# Patient Record
Sex: Female | Born: 1995 | Race: White | Hispanic: No | Marital: Single | Smoking: Never smoker
Health system: Southern US, Community
[De-identification: ages and names within clinical notes are randomized; demographics above are authoritative.]

## PROBLEM LIST (undated history)

## (undated) HISTORY — PX: OTHER SURGICAL HISTORY: SHX169

## (undated) HISTORY — PX: TONSILLECTOMY: SUR1361

---
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
## 2014-06-13 IMAGING — US US RENAL
1 series · 14 of 25 positions shown · non-contrast
Comparison: CT abdomen pelvis 07/04/2012.

CLINICAL DATA: Pyelonephritis.  Status post stent placement and
stone removal from the left kidney.

RENAL/URINARY TRACT ULTRASOUND COMPLETE

[Series 1: us renal · 0.35mm/px · 14 of 26 slices shown]
[im 1/26]
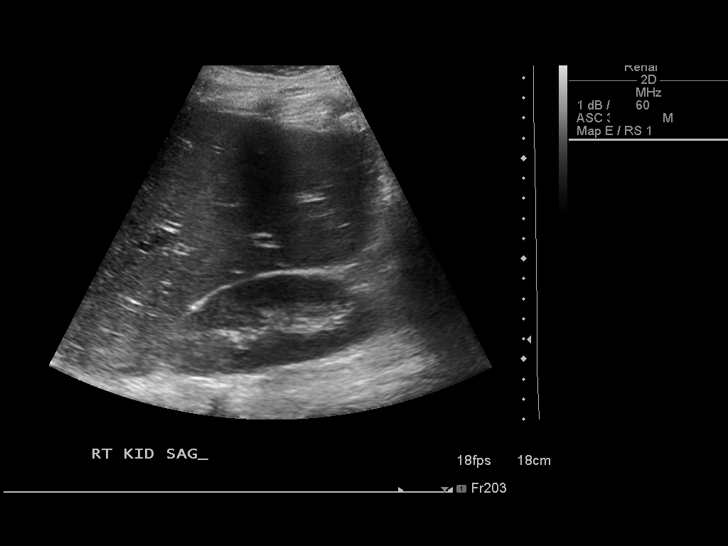
[im 3/26]
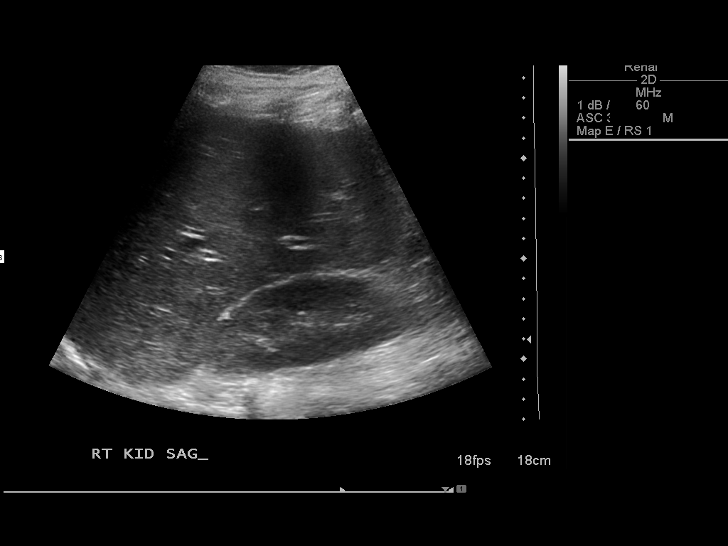
[im 5/26]
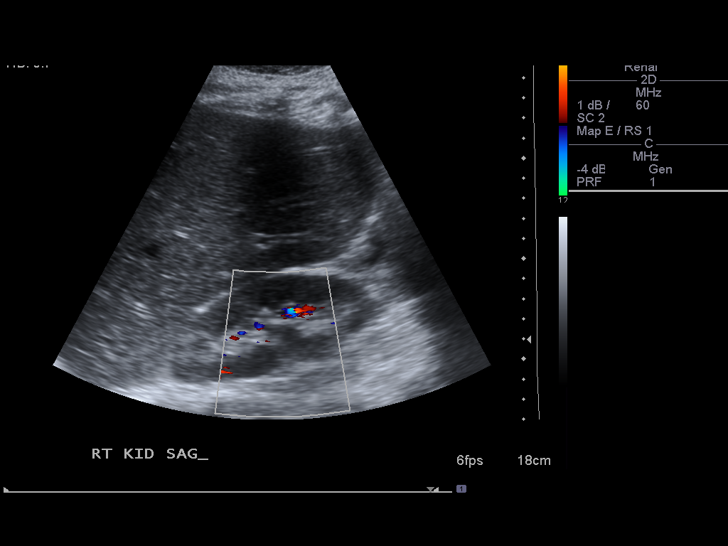
[im 7/26]
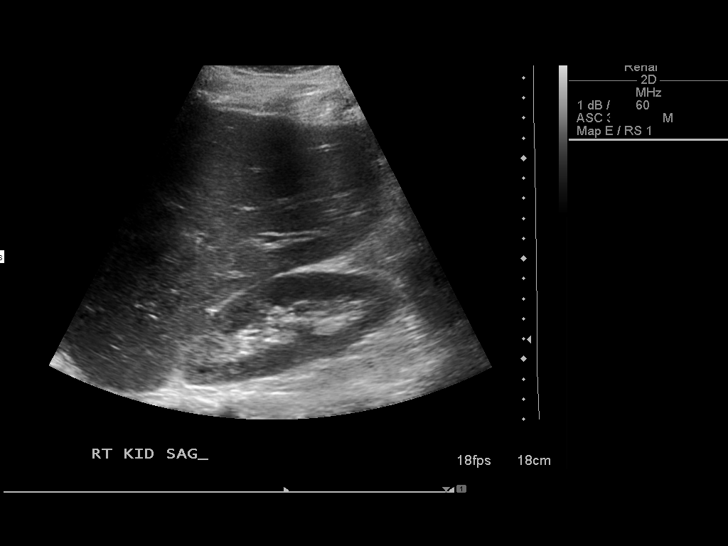
[im 9/26]
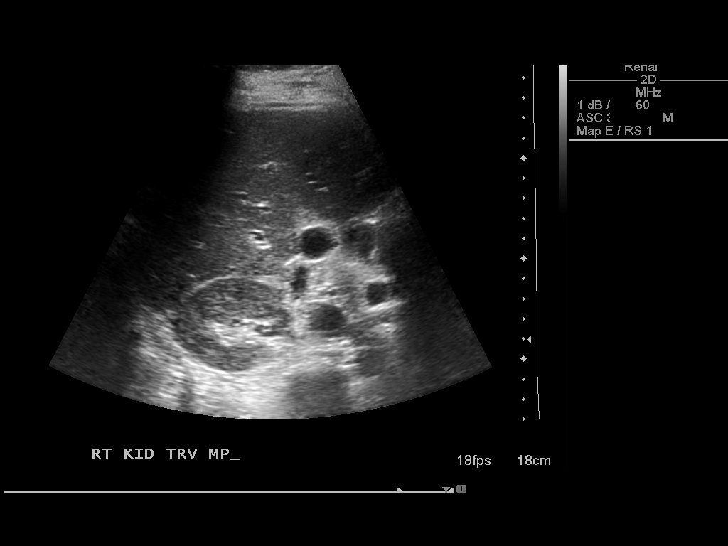
[im 10/26]
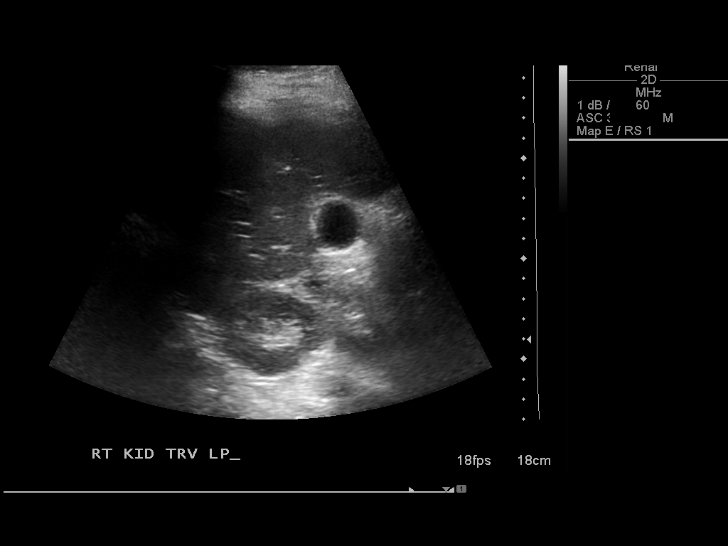
[im 12/26]
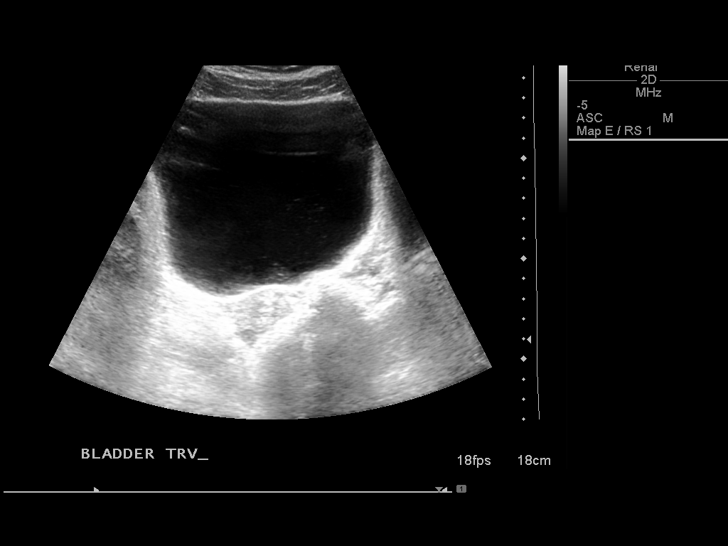
[im 14/26]
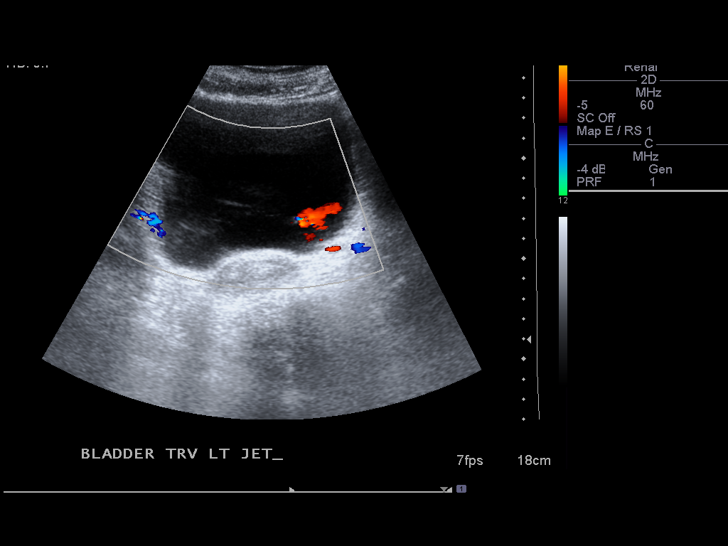
[im 16/26]
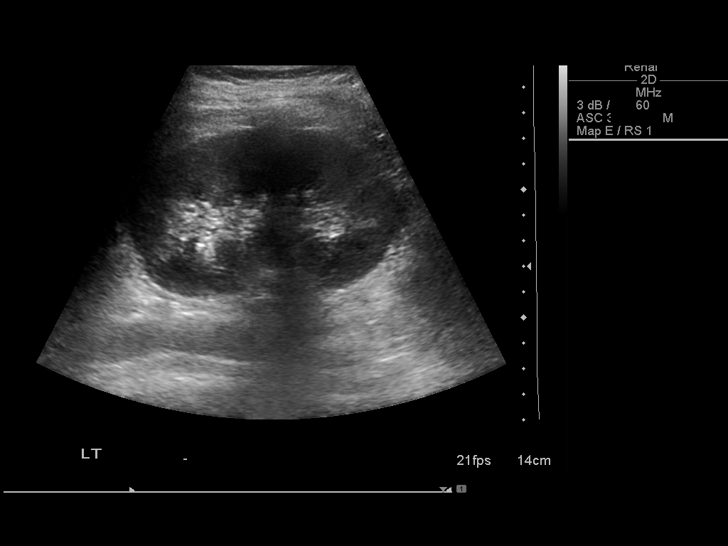
[im 17/26]
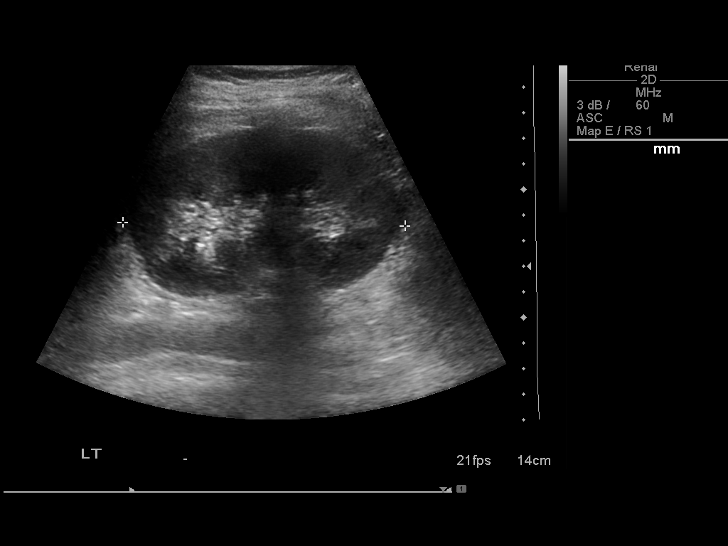
[im 19/26]
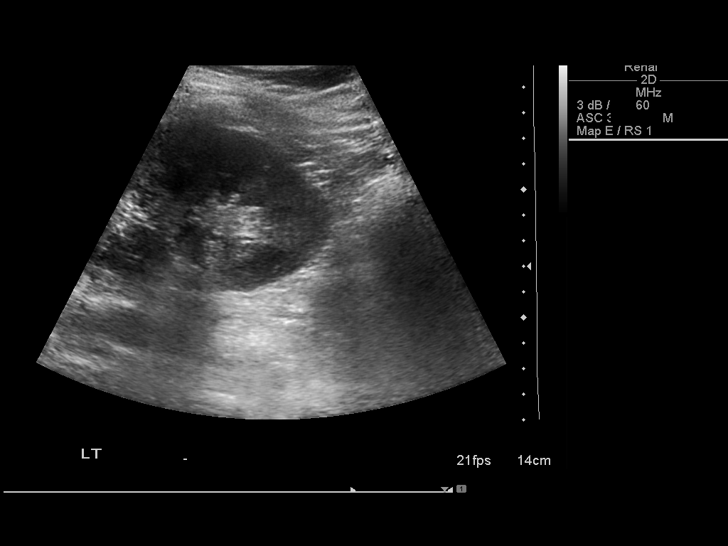
[im 21/26]
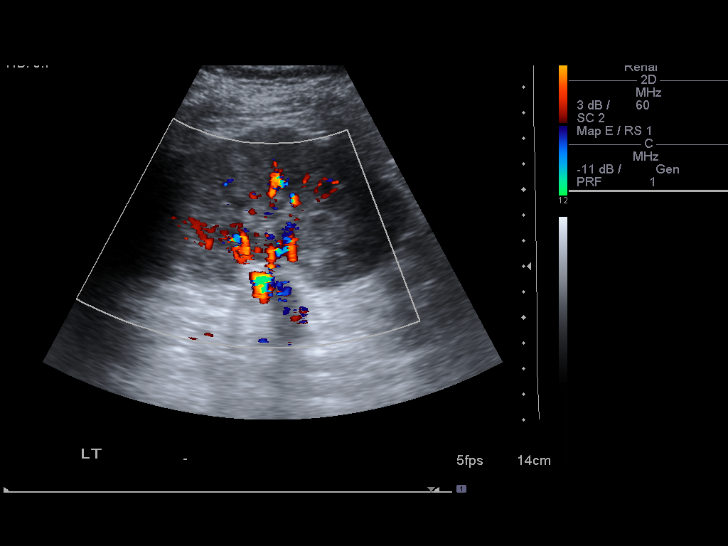
[im 23/26]
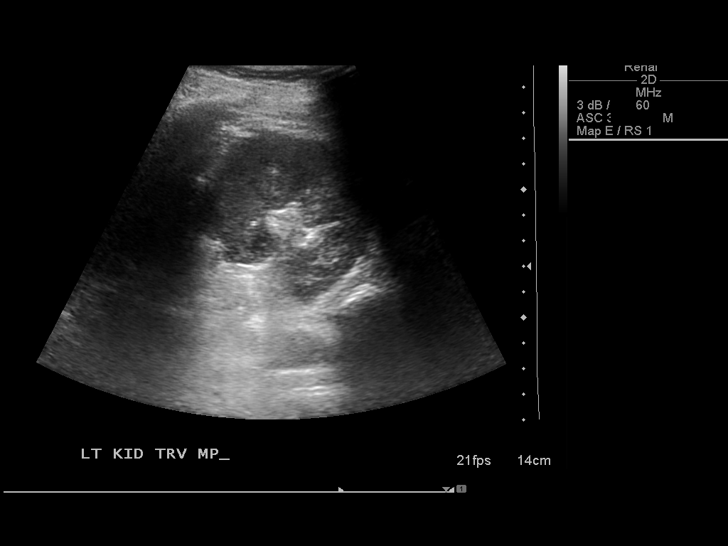
[im 26/26]
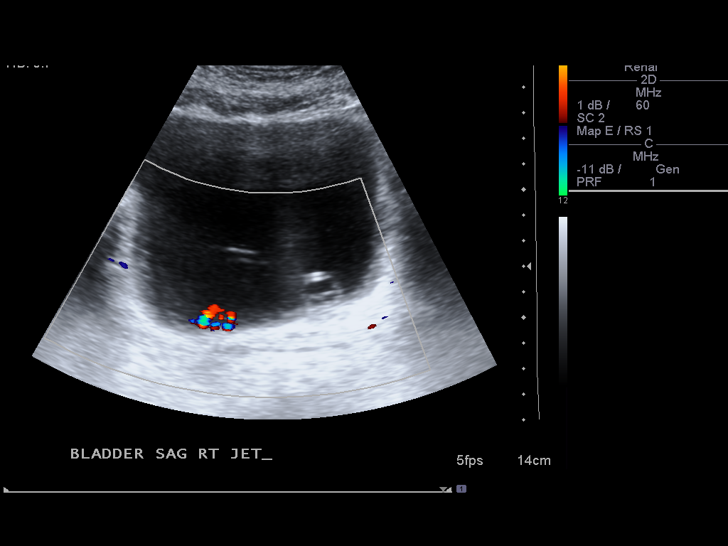

[14 of 25 positions shown; findings below may reference images not displayed]

FINDINGS: Right Kidney:  Measures 11.6 cm.  The right kidney appears normal
without stone, mass or hydronephrosis.

Left Kidney:  Measures 11.1 cm.  No stone, mass or hydronephrosis
is identified.  Double-J ureteral stent is noted.

Bladder:  Double J ureteral stent on the left is identified.
Bilateral ureteral jets are seen.
IMPRESSION: Negative for hydronephrosis or acute abnormality with a left double-
J stent in place.

## 2014-12-11 ENCOUNTER — Ambulatory Visit (INDEPENDENT_AMBULATORY_CARE_PROVIDER_SITE_OTHER): Payer: BLUE CROSS/BLUE SHIELD | Admitting: Sports Medicine

## 2014-12-11 ENCOUNTER — Encounter: Payer: Self-pay | Admitting: Sports Medicine

## 2014-12-11 VITALS — BP 116/80 | HR 71 | Ht 69.0 in | Wt 150.0 lb

## 2014-12-11 DIAGNOSIS — M25572 Pain in left ankle and joints of left foot: Secondary | ICD-10-CM | POA: Diagnosis not present

## 2014-12-11 NOTE — Progress Notes (Signed)
   Subjective:    Patient ID: Jessica Moon, female    DOB: Apr 04, 1995, 20 y.o.   MRN: [id number]  HPI chief complaint: Left foot pain  19 year old female comes in today complaining of left foot pain that has been present since May. No trauma but a gradual onset of pain which was eventually diagnosed as a second metatarsal stress fracture via MRI. She was initially immobilized in a walking boot for about 6 weeks. She then transitioned into a regular shoe with a steel shank. Despite all that she has had persistent pain across the dorsum of her foot. She does localize her pain primarily to the proximal second metatarsal. She has not noticed any swelling. She denies numbness or tingling. Follow-up x-rays were done earlier this week which were unremarkable per the patient's father.  Past medical history is reviewed Medications are reviewed Allergies are reviewed Socially she does not smoke, is a Holiday representative at [employer]    Review of Systems As above    Objective:   Physical Exam Well-developed, well-nourished. No acute distress. Awake alert and oriented 3. Vital signs reviewed.  Left foot: She is tender to palpation over the proximal to midportion of the second metatarsal. Mild pain with metatarsal squeeze. No soft tissue swelling. She has a fairly well-preserved longitudinal arch but does have a long and narrow foot. Neurovascularly intact distally. She is able to walk without a significant limp. Fairly neutral gait.  MSK ultrasound: Limited MSK ultrasound of the second metatarsal was performed. The longitudinal view does show changes in the mid second metatarsal consistent with callus formation from a remote stress fracture. I do not see any cortical irregularity to suggest nonunion. No significant edema.       Assessment & Plan:  Persistent left foot pain status post second metatarsal stress fracture  We will try the patient in a pair of custom orthotics. The steel shank may be a  factor in her prolonged symptoms so I would like for her to discontinue this if possible. She may continue with activity as tolerated but I recommended against any sort of impact activity such as running until she is completely pain-free. She is ok to bike. Follow-up as needed.  Patient was fitted for a : standard, cushioned, semi-rigid orthotic. The orthotic was heated and afterward the patient stood on the orthotic blank positioned on the orthotic stand. The patient was positioned in subtalar neutral position and 10 degrees of ankle dorsiflexion in a weight bearing stance. After completion of molding, a stable base was applied to the orthotic blank. The blank was ground to a stable position for weight bearing. Size:9 Base: Blue EVA Posting: none Additional orthotic padding: none  Patient found the orthotics comfortable prior to leaving the office. Total time spent with the patient was 30 minutes with greater than 50% of the time spent in face to face consultation.

## 2018-03-11 ENCOUNTER — Other Ambulatory Visit: Payer: Self-pay | Admitting: Physical Medicine and Rehabilitation

## 2018-03-11 DIAGNOSIS — M545 Low back pain, unspecified: Secondary | ICD-10-CM

## 2018-03-11 DIAGNOSIS — G8929 Other chronic pain: Secondary | ICD-10-CM

## 2018-03-14 ENCOUNTER — Ambulatory Visit
Admission: RE | Admit: 2018-03-14 | Discharge: 2018-03-14 | Disposition: A | Discharge status: Home / Self Care | Payer: No Typology Code available for payment source | Source: Ambulatory Visit | Attending: Physical Medicine and Rehabilitation | Admitting: Physical Medicine and Rehabilitation

## 2018-03-14 DIAGNOSIS — G8929 Other chronic pain: Secondary | ICD-10-CM

## 2018-03-14 DIAGNOSIS — M545 Low back pain, unspecified: Secondary | ICD-10-CM

## 2018-03-14 MED ORDER — IOPAMIDOL (ISOVUE-M 200) INJECTION 41%
1.0000 mL | Freq: Once | INTRAMUSCULAR | Status: AC
  Administered 2018-03-14: 1 mL via EPIDURAL

## 2018-03-14 MED ORDER — METHYLPREDNISOLONE ACETATE 40 MG/ML INJ SUSP (RADIOLOG
120.0000 mg | Freq: Once | INTRAMUSCULAR | Status: AC
  Administered 2018-03-14: 120 mg via EPIDURAL

## 2018-03-14 NOTE — Discharge Instructions (Signed)

## 2018-08-04 ENCOUNTER — Other Ambulatory Visit: Payer: Self-pay

## 2018-08-04 ENCOUNTER — Ambulatory Visit (INDEPENDENT_AMBULATORY_CARE_PROVIDER_SITE_OTHER): Payer: No Typology Code available for payment source | Admitting: Sports Medicine

## 2018-08-04 ENCOUNTER — Encounter: Payer: Self-pay | Admitting: Sports Medicine

## 2018-08-04 VITALS — BP 108/64 | Ht 70.0 in | Wt 150.0 lb

## 2018-08-04 DIAGNOSIS — M25572 Pain in left ankle and joints of left foot: Secondary | ICD-10-CM

## 2018-08-04 NOTE — Progress Notes (Signed)
   Subjective:    Patient ID: Jessica Moon, female    DOB: Dec 23, 1995, 23 y.o.   MRN: [id number]  HPI chief complaint: Left foot pain  Raegen is a 23 year old female that comes in today with several weeks of left foot pain.  She has been doing quite a bit of walking and began to develop insidious foot pain.  She has a history of a previous stress fracture in the same foot.  Recent MRI of her foot was negative for new stress fracture.  She was given custom orthotics in 2016 which have been helpful.  She is here today for new custom orthotics.  She localizes her foot pain to both her arch as well as the dorsum of the foot.  She brought her walking shoes with her today.  Past medical history reviewed Surgical history reviewed.  History is significant for recent L5-S1 lumbar fusion.  Surgery was in March and she is doing well postoperatively. Medications reviewed   Review of Systems    As above Objective:   Physical Exam  Well-developed, well-nourished.  No acute distress.  Awake alert and oriented x3.  Vital signs reviewed  Examination of her feet in the standing position shows a well preserved longitudinal arch.  There is no obvious swelling of either foot.  She does have a long narrow foot.  Evaluation of her gait shows slight supination.  No limp.     Assessment & Plan:   Left foot pain Status post L5-S1 lumbar fusion  New custom orthotics were created today.  I did leave the lateral aspect of the heel a little more elevated to help correct her slight supination.  Her gait was neutral with the orthotics in place.  She found them to be comfortable prior to leaving the office.  Her current walking shoes are quite old (she thinks that she has had them for about a year).  I recommended that she purchase a new pair of walking shoes and replace them every 300 to 500 miles.  Follow-up as needed.  Patient was fitted for a : standard, cushioned, semi-rigid orthotic. The orthotic was heated and  afterward the patient stood on the orthotic blank positioned on the orthotic stand. The patient was positioned in subtalar neutral position and 10 degrees of ankle dorsiflexion in a weight bearing stance. After completion of molding, a stable base was applied to the orthotic blank. The blank was ground to a stable position for weight bearing. Size: 10 Base: Blue EVA Posting: none Additional orthotic padding: none

## 2018-10-01 ENCOUNTER — Encounter (HOSPITAL_COMMUNITY): Payer: Self-pay | Admitting: Radiology

## 2018-10-01 ENCOUNTER — Other Ambulatory Visit: Payer: Self-pay

## 2018-10-01 ENCOUNTER — Emergency Department (HOSPITAL_COMMUNITY): Payer: No Typology Code available for payment source

## 2018-10-01 ENCOUNTER — Emergency Department (HOSPITAL_COMMUNITY)
Admission: EM | Admit: 2018-10-01 | Discharge: 2018-10-01 | Disposition: A | Discharge status: Home / Self Care | Payer: No Typology Code available for payment source | Attending: Emergency Medicine | Admitting: Emergency Medicine

## 2018-10-01 DIAGNOSIS — Z20828 Contact with and (suspected) exposure to other viral communicable diseases: Secondary | ICD-10-CM | POA: Diagnosis not present

## 2018-10-01 DIAGNOSIS — R509 Fever, unspecified: Secondary | ICD-10-CM | POA: Diagnosis present

## 2018-10-01 DIAGNOSIS — R1032 Left lower quadrant pain: Secondary | ICD-10-CM | POA: Diagnosis not present

## 2018-10-01 DIAGNOSIS — L03311 Cellulitis of abdominal wall: Secondary | ICD-10-CM | POA: Insufficient documentation

## 2018-10-01 DIAGNOSIS — Z79899 Other long term (current) drug therapy: Secondary | ICD-10-CM | POA: Insufficient documentation

## 2018-10-01 LAB — BASIC METABOLIC PANEL
Anion gap: 10 (ref 5–15)
BUN: 8 mg/dL (ref 6–20)
CO2: 22 mmol/L (ref 22–32)
Calcium: 8.8 mg/dL — ABNORMAL LOW (ref 8.9–10.3)
Chloride: 106 mmol/L (ref 98–111)
Creatinine, Ser: 0.83 mg/dL (ref 0.44–1.00)
GFR calc Af Amer: >60 mL/min (ref >60)
GFR calc non Af Amer: >60 mL/min (ref >60)
Glucose, Bld: 97 mg/dL (ref 70–99)
Potassium: 3.4 mmol/L — ABNORMAL LOW (ref 3.5–5.1)
Sodium: 138 mmol/L (ref 135–145)

## 2018-10-01 LAB — URINALYSIS, ROUTINE W REFLEX MICROSCOPIC
Bilirubin Urine: NEGATIVE
Glucose, UA: NEGATIVE mg/dL
Hgb urine dipstick: NEGATIVE
Ketones, ur: 5 mg/dL — AB
Leukocytes,Ua: NEGATIVE
Nitrite: NEGATIVE
Protein, ur: NEGATIVE mg/dL
Specific Gravity, Urine: 1.013 (ref 1.005–1.030)
pH: 6 (ref 5.0–8.0)

## 2018-10-01 LAB — I-STAT BETA HCG BLOOD, ED (MC, WL, AP ONLY): I-stat hCG, quantitative: <5 m[IU]/mL (ref <5)

## 2018-10-01 LAB — CBC WITH DIFFERENTIAL/PLATELET
Abs Immature Granulocytes: 0.03 10*3/uL (ref 0.00–0.07)
Basophils Absolute: 0 10*3/uL (ref 0.0–0.1)
Basophils Relative: 0 %
Eosinophils Absolute: 0 10*3/uL (ref 0.0–0.5)
Eosinophils Relative: 1 %
HCT: 41 % (ref 36.0–46.0)
Hemoglobin: 14 g/dL (ref 12.0–15.0)
Immature Granulocytes: 1 %
Lymphocytes Relative: 23 %
Lymphs Abs: 1.3 10*3/uL (ref 0.7–4.0)
MCH: 30.4 pg (ref 26.0–34.0)
MCHC: 34.1 g/dL (ref 30.0–36.0)
MCV: 88.9 fL (ref 80.0–100.0)
Monocytes Absolute: 0.7 10*3/uL (ref 0.1–1.0)
Monocytes Relative: 13 %
Neutro Abs: 3.5 10*3/uL (ref 1.7–7.7)
Neutrophils Relative %: 62 %
Platelets: 159 10*3/uL (ref 150–400)
RBC: 4.61 MIL/uL (ref 3.87–5.11)
RDW: 12.2 % (ref 11.5–15.5)
WBC: 5.6 10*3/uL (ref 4.0–10.5)
nRBC: 0 % (ref 0.0–0.2)

## 2018-10-01 LAB — SARS CORONAVIRUS 2 BY RT PCR (HOSPITAL ORDER, PERFORMED IN ~~LOC~~ HOSPITAL LAB): SARS Coronavirus 2: NEGATIVE

## 2018-10-01 LAB — LACTIC ACID, PLASMA: Lactic Acid, Venous: 0.7 mmol/L (ref 0.5–1.9)

## 2018-10-01 MED ORDER — SODIUM CHLORIDE 0.9 % IV BOLUS
1000.0000 mL | Freq: Once | INTRAVENOUS | Status: AC
  Administered 2018-10-01: 1000 mL via INTRAVENOUS

## 2018-10-01 MED ORDER — IOHEXOL 300 MG/ML  SOLN
100.0000 mL | Freq: Once | INTRAMUSCULAR | Status: AC | PRN
  Administered 2018-10-01: 100 mL via INTRAVENOUS

## 2018-10-01 NOTE — ED Notes (Signed)
ED Provider at bedside. 

## 2018-10-01 NOTE — ED Triage Notes (Addendum)
Pt arrives POV from home c/o fever, body aches, pelvic pain, and feeling lethargic. Pt reports sxs started Tuesday, but fever did not develop til yesterday (temp range 100-104 per pt). Pt was seen at gyno's office yesterday for in-office procedure and has taken 2 grams of rocephin IM (1 gram yesterday and 1 gram today). Pt reports being around friend who has been in contact with someone who is COVID positive.

## 2018-10-01 NOTE — Discharge Instructions (Addendum)
Take the Keflex and Bactrim and watch for worsening of the infection.  Blood cultures were done and you will be notified if they grow anything out.

## 2018-10-01 NOTE — ED Notes (Signed)
Patient transported to CT 

## 2018-10-01 NOTE — ED Notes (Signed)
Patient verbalizes understanding of discharge instructions. Opportunity for questioning and answers were provided. Armband removed by staff, pt discharged from ED.  

## 2018-10-01 NOTE — ED Provider Notes (Signed)
[facility] Provider Note   CSN: [id number] Arrival date & time: 10/01/18  1057    History   Chief Complaint Chief Complaint  Patient presents with  . Fever  . Generalized Body Aches    chills/nausea    HPI Jessica Moon is a 23 y.o. female.     HPI Patient presents with fever.  Recently treated for cellulitis/abscess of left groin.  Began a few days ago.  Initially seen at urgent care and thought was a cellulitis.  Then went to gynecology who drained an abscess couple days ago.  Given shot of Rocephin IM yesterday and then had another shot at home today.  Also given oral Bactrim.  States fevers started yesterday but went up to 100.4 this morning.  No cough.  No dysuria.  The redness was marked on her abdomen/groin area and his expanded outside the demarcated area.  States she feels as if the pain is improving in the area however.  No chest pain.  No trouble breathing.  No direct COVID contacts.  Denies possibly a pregnancy.  She had Tylenol this morning at home. History reviewed. No pertinent past medical history.  Patient Active Problem List   Diagnosis Date Noted  . Fever 09/21/2012  . Sore throat 09/21/2012  . Nephrolithiasis 07/05/2012  . Acute pyelonephritis 07/05/2012  . Normocytic anemia 07/05/2012  . Hyperglycemia 07/05/2012  . 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 Sepsis(995.91) 07/05/2012  . Hypotension 07/05/2012    Past Surgical History:  Procedure Laterality Date  . CYSTOSCOPY W/ URETERAL STENT PLACEMENT Left 07/04/2012   Procedure: CYSTOSCOPY WITH STENT REPLACEMENT;  Surgeon: Kathi LudwigSigmund I Tannenbaum, MD;  Location: [facility];  Service: Urology;  Laterality: Left;  . CYSTOSCOPY/RETROGRADE/URETEROSCOPY/STONE EXTRACTION WITH BASKET Left 07/04/2012   Procedure: CYSTOSCOPY/RETROGRADE LEFT RETROGRADE/LEFT URETEROSCOPY/STONE EXTRACTION WITH BASKET/INSERTION DOUBLE J STENT LEFT;  Surgeon: Kathi LudwigSigmund I Tannenbaum, MD;  Location:  [facility];  Service: Urology;  Laterality: Left;  . TONSILLECTOMY    . Tubes in ears, nasal reconstruction Bilateral      OB History   No obstetric history on file.      Home Medications    Prior to Admission medications   Medication Sig Start Date End Date Taking? Authorizing Provider  acetaminophen (TYLENOL) 500 MG tablet Take 1,000 mg by mouth every 6 (six) hours as needed for pain.    [provider]  amphetamine-dextroamphetamine (ADDERALL) 15 MG tablet Take 15 mg by mouth daily.    [provider]  celecoxib (CELEBREX) 200 MG capsule Take 200 mg by mouth daily.    [provider]  ciprofloxacin (CIPRO) 750 MG tablet Take 1 tablet (750 mg total) by mouth 2 (two) times daily. Patient not taking: Reported on 12/11/2014 07/08/12   Jethro Bolusannenbaum, Sigmund, MD  etonogestrel (NEXPLANON) 68 MG IMPL implant 1 each by Subdermal route once.    [provider]  traMADol-acetaminophen (ULTRACET) 37.5-325 MG per tablet Take 1 tablet by mouth every 6 (six) hours as needed for pain. Patient not taking: Reported on 12/11/2014 07/08/12   Jethro Bolusannenbaum, Sigmund, MD    Family History No family history on file.  Social History Social History   Tobacco Use  . Smoking status: Never Smoker  . Smokeless tobacco: Never Used  Substance Use Topics  . Alcohol use: Yes    Comment: socially, beer  . Drug use: No     Allergies   Patient has no known allergies.   Review of Systems  Review of Systems  Constitutional: Positive for appetite change, chills and fever.  HENT: Negative for congestion.   Respiratory: Negative for shortness of breath.   Cardiovascular: Negative for chest pain.  Gastrointestinal: Negative for abdominal pain.  Genitourinary: Negative for flank pain.  Musculoskeletal: Negative for neck pain.  Skin: Positive for wound.  Neurological: Negative for weakness.  Psychiatric/Behavioral: Negative for confusion.     Physical Exam Updated Vital Signs  BP 110/72 (BP Location: Left Arm)   Pulse 64   Temp 98.1 F (36.7 C) (Oral)   Resp 16   SpO2 100%   Physical Exam Vitals signs and nursing note reviewed.  Constitutional:      Appearance: Normal appearance.  HENT:     Head: Normocephalic.  Eyes:     Pupils: Pupils are equal, round, and reactive to light.  Cardiovascular:     Rate and Rhythm: Regular rhythm.  Pulmonary:     Breath sounds: No wheezing, rhonchi or rales.  Abdominal:     Tenderness: There is no abdominal tenderness.  Skin:    General: Skin is warm.     Capillary Refill: Capillary refill takes less than 2 seconds.     Comments: Left inguinal/groin area erythema with mild induration.  No fluctuance.  Does progress somewhat laterally and slightly down his labial area.  Neurological:     Mental Status: Mental status is at baseline.      ED Treatments / Results  Labs (all labs ordered are listed, but only abnormal results are displayed) Labs Reviewed  BASIC METABOLIC PANEL - Abnormal; Notable for the following components:      Result Value   Potassium 3.4 (*)    Calcium 8.8 (*)    All other components within normal limits  SARS CORONAVIRUS 2 (HOSPITAL ORDER, [facility])  CULTURE, BLOOD (ROUTINE X 2)  CULTURE, BLOOD (ROUTINE X 2)  LACTIC ACID, PLASMA  CBC WITH DIFFERENTIAL/PLATELET  LACTIC ACID, PLASMA  URINALYSIS, ROUTINE W REFLEX MICROSCOPIC  I-STAT BETA HCG BLOOD, ED ([facility], [facility], [facility] ONLY)    EKG None  Radiology Ct Pelvis W Contrast  Result Date: 10/01/2018 CLINICAL DATA:  Left groin cellulitis, postop infection EXAM: CT PELVIS WITH CONTRAST TECHNIQUE: Multidetector CT imaging of the pelvis was performed using the standard protocol following the bolus administration of intravenous contrast. CONTRAST:  150mL OMNIPAQUE IOHEXOL 300 MG/ML  SOLN COMPARISON:  MR 03/28/2018, CT 09/21/2012 FINDINGS: Urinary Tract: Urinary bladder is nondistended. Distal ureters decompressed. Bowel:  Visualized portions of small bowel and colon are nondilated, unremarkable. Vascular/Lymphatic: No significant vascular pathology evident. 1 cm prominent left inguinal lymph node possibly reactive. Scattered subcentimeter morphologically unremarkable Dr. Has not external iliac and right inguinal lymph nodes. Reproductive:  No mass or other significant abnormality Other:  Trace cul-de-sac free fluid. Left pelvic phleboliths. Musculoskeletal: Skin thickening overlying the left mons pubis and inguinal region, with inflammatory/edematous changes in the deep subcutaneous tissues in these regions. No abnormal enhancement or discrete fluid collection. The inflammatory changes do not extend deep to the body wall musculature. Interval changes of instrumented posterolateral and interbody fusion L5-S1 without apparent complication. No fracture or worrisome bone lesion. IMPRESSION: 1. Skin thickening and inflammatory/edematous changes in the deep subcutaneous tissues of the left inguinal region, suggesting cellulitis, without evidence of abscess. 2. Trace cul-de-sac free fluid, nonspecific. 3. Interval L5-S1 instrumented and interbody fusion without apparent complication. Electronically Signed   By: Lucrezia Europe M.D.   On: 10/01/2018 13:47  Procedures Procedures (including critical care time)  Medications Ordered in ED Medications  sodium chloride 0.9 % bolus 1,000 mL (0 mLs Intravenous Stopped 10/01/18 1246)  iohexol (OMNIPAQUE) 300 MG/ML solution 100 mL (100 mLs Intravenous Contrast Given 10/01/18 1334)     Initial Impression / Assessment and Plan / ED Course  I have reviewed the triage vital signs and the nursing notes.  Pertinent labs & imaging results that were available during my care of the patient were reviewed by me and considered in my medical decision making (see chart for details).        Patient with cellulitis lower abdominal wall.  Had temperature up to 104 at home.  However lab work is very  reassuring.  Feels better now.  CT scan done did not show any abscess.  Discharged with patient's father, who is physician.  Will take both the Keflex she was initially given and the Bactrim she has at home.  Do not think this is the failure of antibiotics yet.  Final Clinical Impressions(s) / ED Diagnoses   Final diagnoses:  Cellulitis of abdominal wall    ED Discharge Orders    None       Benjiman CorePickering, Letesha Klecker, MD 10/01/18 1406

## 2018-10-06 LAB — CULTURE, BLOOD (ROUTINE X 2)
Culture: NO GROWTH
Culture: NO GROWTH
Special Requests: ADEQUATE
Special Requests: ADEQUATE

## 2020-02-19 IMAGING — XA Imaging study
2 series · 2 of 2 positions shown · non-contrast
Comparison: none

CLINICAL DATA: Low back pain. Bilateral lower extremity
radiculopathy, right greater than left. Displacement the L4-5 and
L5-S1 lumbar discs.

[Series 1: ortho standard · 1 of 1 slices shown (1 of 2)]
[im 1/1]
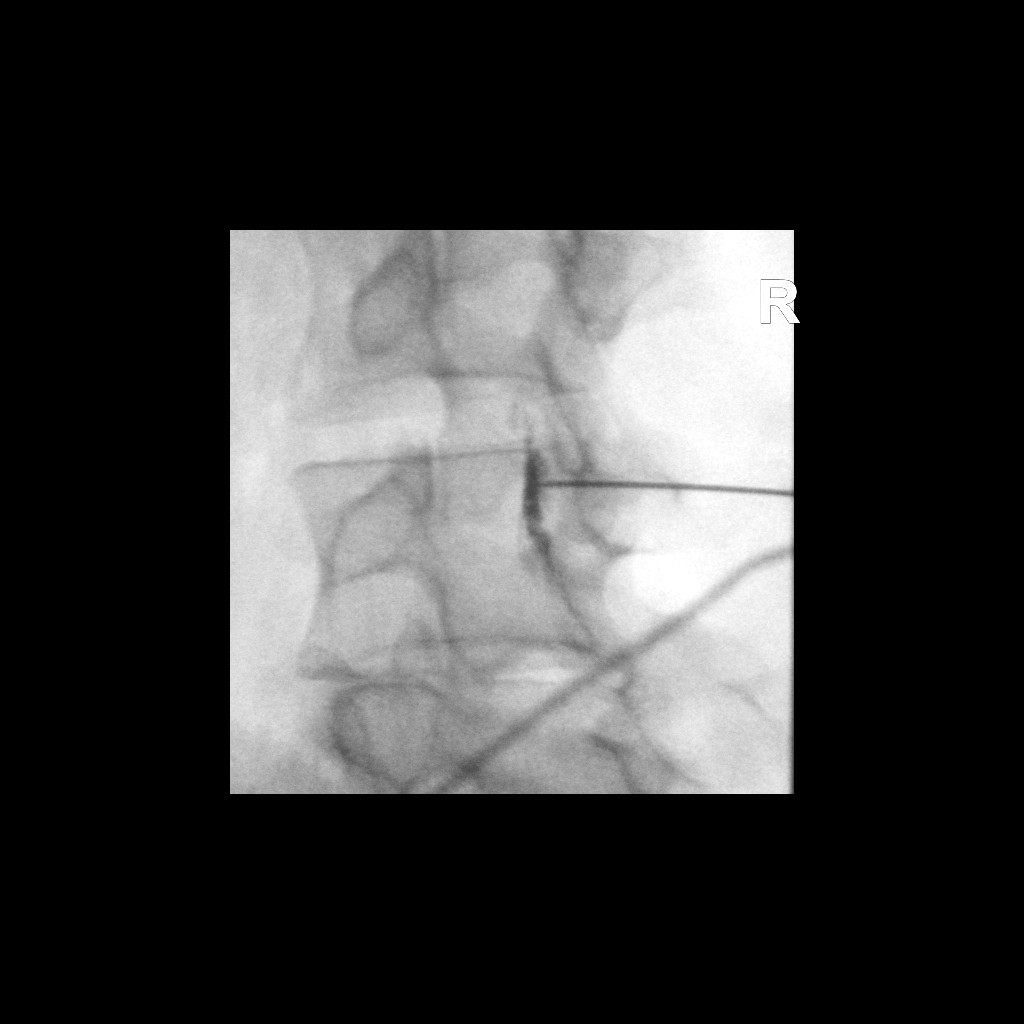

[Series 2: ortho standard · 1 of 1 slices shown (2 of 2)]
[im 1/1]
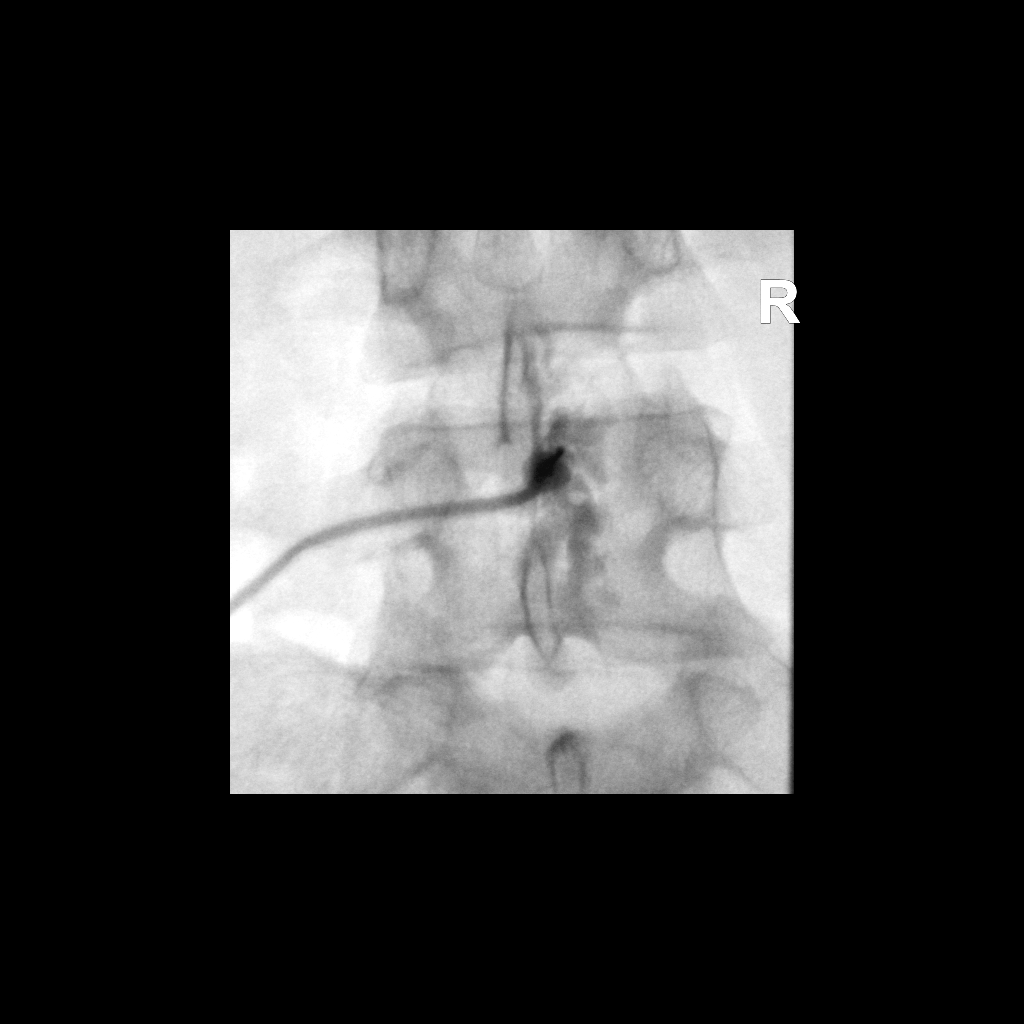

[2 of 2 positions shown; findings below may reference images not displayed]

FLUOROSCOPY TIME:  Radiation Exposure Index (as provided by the
fluoroscopic device): 9.32 uGy*m2

Fluoroscopy Time:  13 seconds

Number of Acquired Images:  0

PROCEDURE:
The procedure, risks, benefits, and alternatives were explained to
the patient. Questions regarding the procedure were encouraged and
answered. The patient understands and consents to the procedure.

LUMBAR EPIDURAL INJECTION:

An interlaminar approach was performed on right at L4-5. The
overlying skin was cleansed and anesthetized. A 20 gauge epidural
needle was advanced using loss-of-resistance technique.

DIAGNOSTIC EPIDURAL INJECTION:

Injection of Isovue-M 200 shows a good epidural pattern with spread
above and below the level of needle placement, primarily on the
right no vascular opacification is seen.

THERAPEUTIC EPIDURAL INJECTION:

120 mg of Depo-Medrol mixed with 3 mL 1% lidocaine were instilled.
The procedure was well-tolerated, and the patient was discharged
thirty minutes following the injection in good condition.

COMPLICATIONS:
None
IMPRESSION: Technically successful epidural injection on the right L4-5 # 1
